# Patient Record
Sex: Female | Born: 1960 | Race: White | Hispanic: No | Marital: Married | Smoking: Never smoker
Health system: Southern US, Community
[De-identification: ages and names within clinical notes are randomized; demographics above are authoritative.]

## PROBLEM LIST (undated history)

## (undated) DIAGNOSIS — Z8616 Personal history of COVID-19: Secondary | ICD-10-CM

## (undated) DIAGNOSIS — I10 Essential (primary) hypertension: Secondary | ICD-10-CM

## (undated) DIAGNOSIS — E785 Hyperlipidemia, unspecified: Secondary | ICD-10-CM

## (undated) HISTORY — PX: GALLBLADDER SURGERY: SHX652

## (undated) HISTORY — PX: TONSILLECTOMY: SUR1361

## (undated) HISTORY — DX: Personal history of COVID-19: Z86.16

## (undated) HISTORY — DX: Hyperlipidemia, unspecified: E78.5

## (undated) HISTORY — DX: Essential (primary) hypertension: I10

---

## 2003-02-22 ENCOUNTER — Ambulatory Visit (HOSPITAL_COMMUNITY): Admission: RE | Admit: 2003-02-22 | Discharge: 2003-02-22 | Payer: Self-pay | Admitting: Family Medicine

## 2003-02-22 ENCOUNTER — Encounter: Payer: Self-pay | Admitting: Family Medicine

## 2003-04-12 ENCOUNTER — Other Ambulatory Visit: Admission: RE | Admit: 2003-04-12 | Discharge: 2003-04-12 | Payer: Self-pay | Admitting: Family Medicine

## 2003-06-08 ENCOUNTER — Encounter: Admission: RE | Admit: 2003-06-08 | Discharge: 2003-09-06 | Payer: Self-pay | Admitting: Specialist

## 2003-12-29 ENCOUNTER — Encounter (INDEPENDENT_AMBULATORY_CARE_PROVIDER_SITE_OTHER): Payer: Self-pay | Admitting: Specialist

## 2003-12-29 ENCOUNTER — Ambulatory Visit (HOSPITAL_COMMUNITY): Admission: RE | Admit: 2003-12-29 | Discharge: 2003-12-29 | Payer: Self-pay | Admitting: Gastroenterology

## 2004-05-24 ENCOUNTER — Ambulatory Visit (HOSPITAL_COMMUNITY): Admission: RE | Admit: 2004-05-24 | Discharge: 2004-05-24 | Payer: Self-pay | Admitting: Family Medicine

## 2006-06-26 ENCOUNTER — Other Ambulatory Visit: Admission: RE | Admit: 2006-06-26 | Discharge: 2006-06-26 | Payer: Self-pay | Admitting: Family Medicine

## 2007-11-04 ENCOUNTER — Ambulatory Visit (HOSPITAL_COMMUNITY): Admission: RE | Admit: 2007-11-04 | Discharge: 2007-11-04 | Payer: Self-pay | Admitting: Family Medicine

## 2007-11-12 ENCOUNTER — Other Ambulatory Visit: Admission: RE | Admit: 2007-11-12 | Discharge: 2007-11-12 | Payer: Self-pay | Admitting: Family Medicine

## 2009-01-25 ENCOUNTER — Ambulatory Visit (HOSPITAL_COMMUNITY): Admission: RE | Admit: 2009-01-25 | Discharge: 2009-01-25 | Payer: Self-pay | Admitting: Gastroenterology

## 2009-10-04 ENCOUNTER — Other Ambulatory Visit: Admission: RE | Admit: 2009-10-04 | Discharge: 2009-10-04 | Payer: Self-pay | Admitting: Obstetrics and Gynecology

## 2010-05-24 ENCOUNTER — Other Ambulatory Visit (HOSPITAL_COMMUNITY): Payer: Self-pay | Admitting: Obstetrics and Gynecology

## 2010-05-24 DIAGNOSIS — Z139 Encounter for screening, unspecified: Secondary | ICD-10-CM

## 2010-06-05 ENCOUNTER — Ambulatory Visit (HOSPITAL_COMMUNITY)
Admission: RE | Admit: 2010-06-05 | Discharge: 2010-06-05 | Disposition: A | Discharge status: Home / Self Care | Payer: 59 | Source: Ambulatory Visit | Attending: Obstetrics and Gynecology | Admitting: Obstetrics and Gynecology

## 2010-06-05 DIAGNOSIS — Z1231 Encounter for screening mammogram for malignant neoplasm of breast: Secondary | ICD-10-CM | POA: Insufficient documentation

## 2010-06-05 DIAGNOSIS — Z139 Encounter for screening, unspecified: Secondary | ICD-10-CM

## 2010-09-15 NOTE — Op Note (Signed)
NAME:  Vickie Shaw, Vickie Shaw                        ACCOUNT NO.:  [id number]   MEDICAL RECORD NO.:  [id number]                   PATIENT TYPE:  AMB   LOCATION:  ENDO                                 FACILITY:  [facility]   PHYSICIAN:  Bernette Redbird, M.D.                DATE OF BIRTH:  [birth year]   DATE OF PROCEDURE:  12/29/2003  DATE OF DISCHARGE:                                 OPERATIVE REPORT   PROCEDURE:  Colonoscopy with biopsy.   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   FINDINGS:  Diminutive sessile polyp removed.   PROCEDURE:  The nature, purposes, and risks of the procedure have been  discussed with the patient, who provided her consent.  Sedation was fentanyl  75 mcg and Versed 8 mg IV without arrhythmias or desaturation.  The Olympus  adjustable tension pediatric video colonoscope is readily advanced to the  terminal ileum, which had a normal appearance.  A pull-back was performed.  The quality of the prep was excellent.  It was felt that all areas were well  seen.  The only abnormality on this exam was a 2-3 mm sessile hyperplastic-  appearing polyp, removed by single cold biopsy on the way in, from the  region, I believe, of the hepatic flexure.  No other polyps were seen, and  there was no evidence of cancer, colitis, vascular malformations, or  diverticulosis.  Retroflexion in the rectum and reinspection of the rectum  was unremarkable.  The patient tolerated the procedure well, and there were  no apparent complications.   IMPRESSION:  1.  Solitary diminutive colon polyp, removed as described above.  (211.3).  2.  Family history of colon cancer.  3.  Family history of colon polyps.   PLAN:  Await pathology on the polyp with anticipated colonoscopic followup  in five years, in view of the family  history.                                               Bernette Redbird, M.D.    RB/MEDQ  D:  12/29/2003  T:  12/29/2003  Job:  [id number]   cc:   Angelena Sole, M.D. [facility]

## 2011-06-04 ENCOUNTER — Other Ambulatory Visit (HOSPITAL_COMMUNITY): Payer: Self-pay | Admitting: Obstetrics and Gynecology

## 2011-06-04 DIAGNOSIS — Z1231 Encounter for screening mammogram for malignant neoplasm of breast: Secondary | ICD-10-CM

## 2011-07-02 ENCOUNTER — Ambulatory Visit (HOSPITAL_COMMUNITY)
Admission: RE | Admit: 2011-07-02 | Discharge: 2011-07-02 | Disposition: A | Discharge status: Home / Self Care | Payer: 59 | Source: Ambulatory Visit | Attending: Obstetrics and Gynecology | Admitting: Obstetrics and Gynecology

## 2011-07-02 DIAGNOSIS — Z1231 Encounter for screening mammogram for malignant neoplasm of breast: Secondary | ICD-10-CM | POA: Insufficient documentation

## 2012-03-21 IMAGING — MG MM DIGITAL SCREENING BILAT W/ CAD
4 series · 4 of 4 positions shown · non-contrast
Comparison: Prior studies.

DG SCREEN MAMMOGRAM BILATERAL
Bilateral CC and MLO view(s) were taken.
Technologist: Dainara Perao, RT RM
Prior study comparison: November 04, 2007, DG screen mammogram bilateral.

DIGITAL SCREENING MAMMOGRAM WITH CAD:

[R CC]
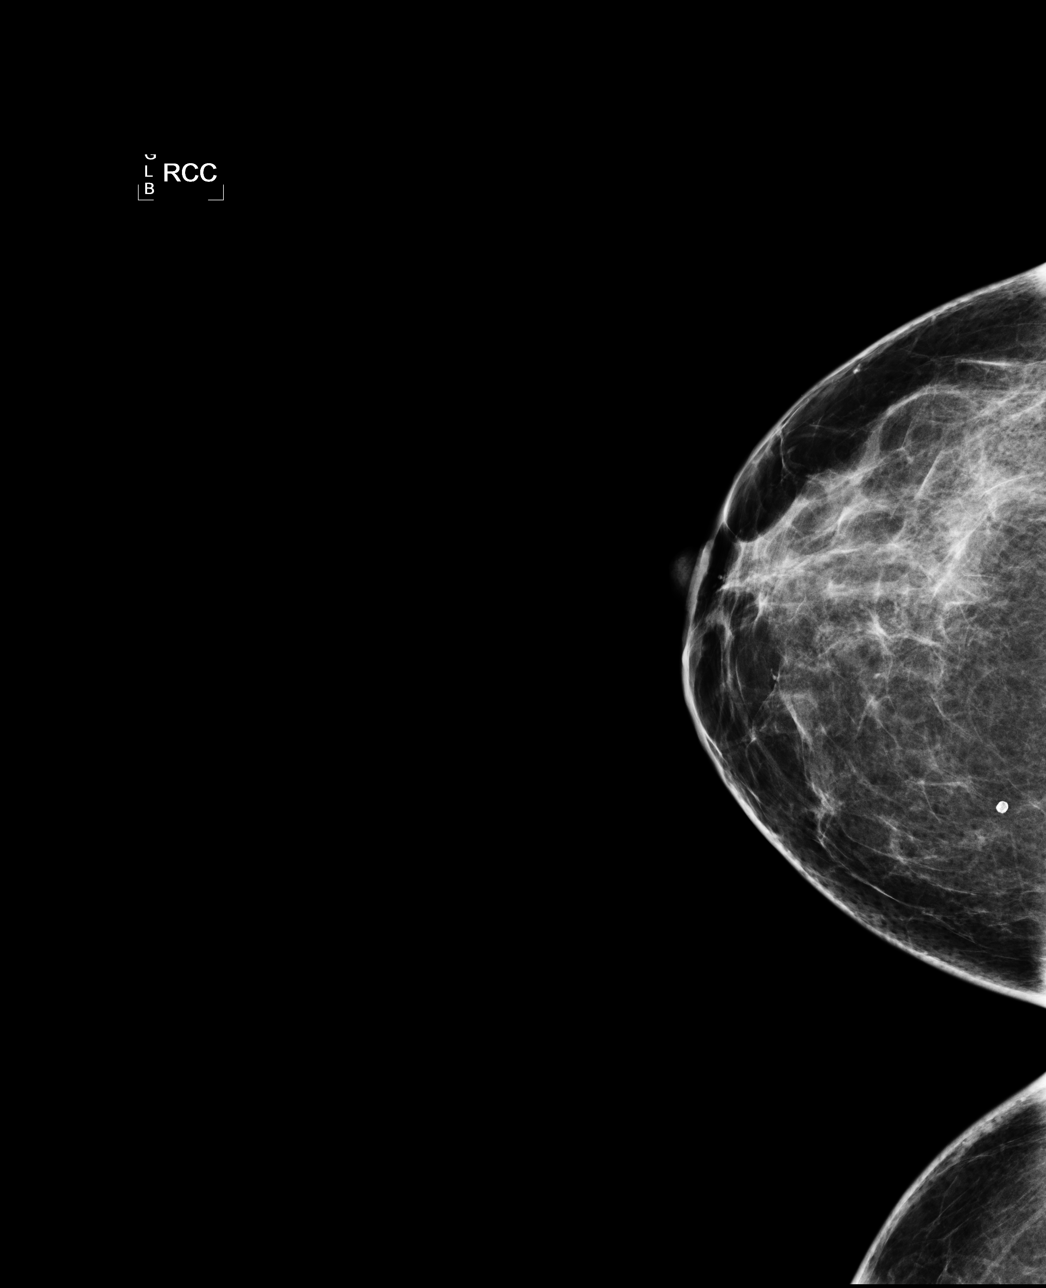

[R MLO]
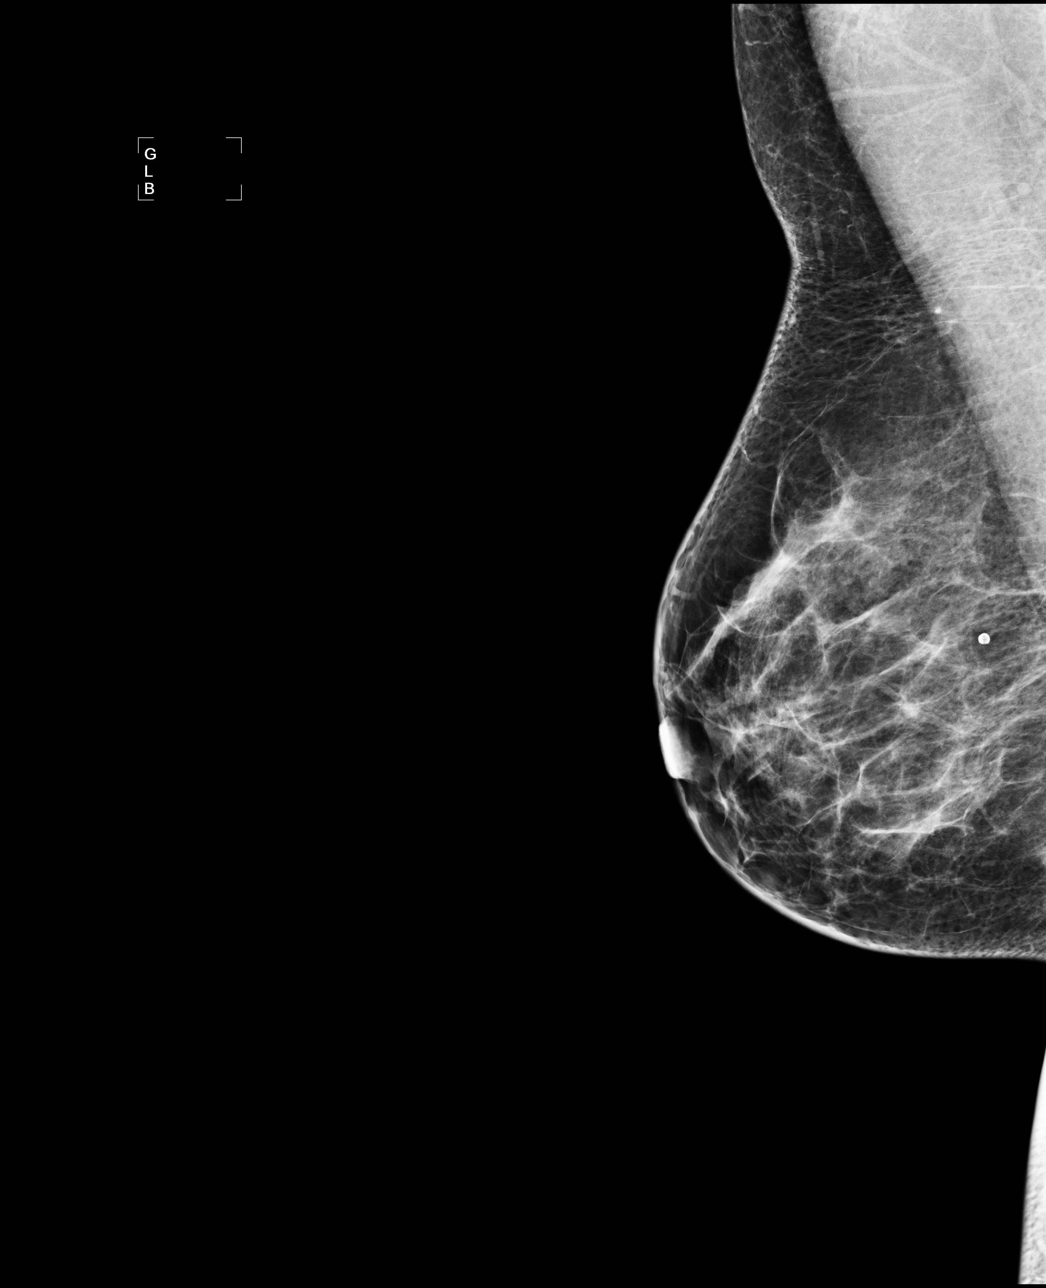

[L CC]
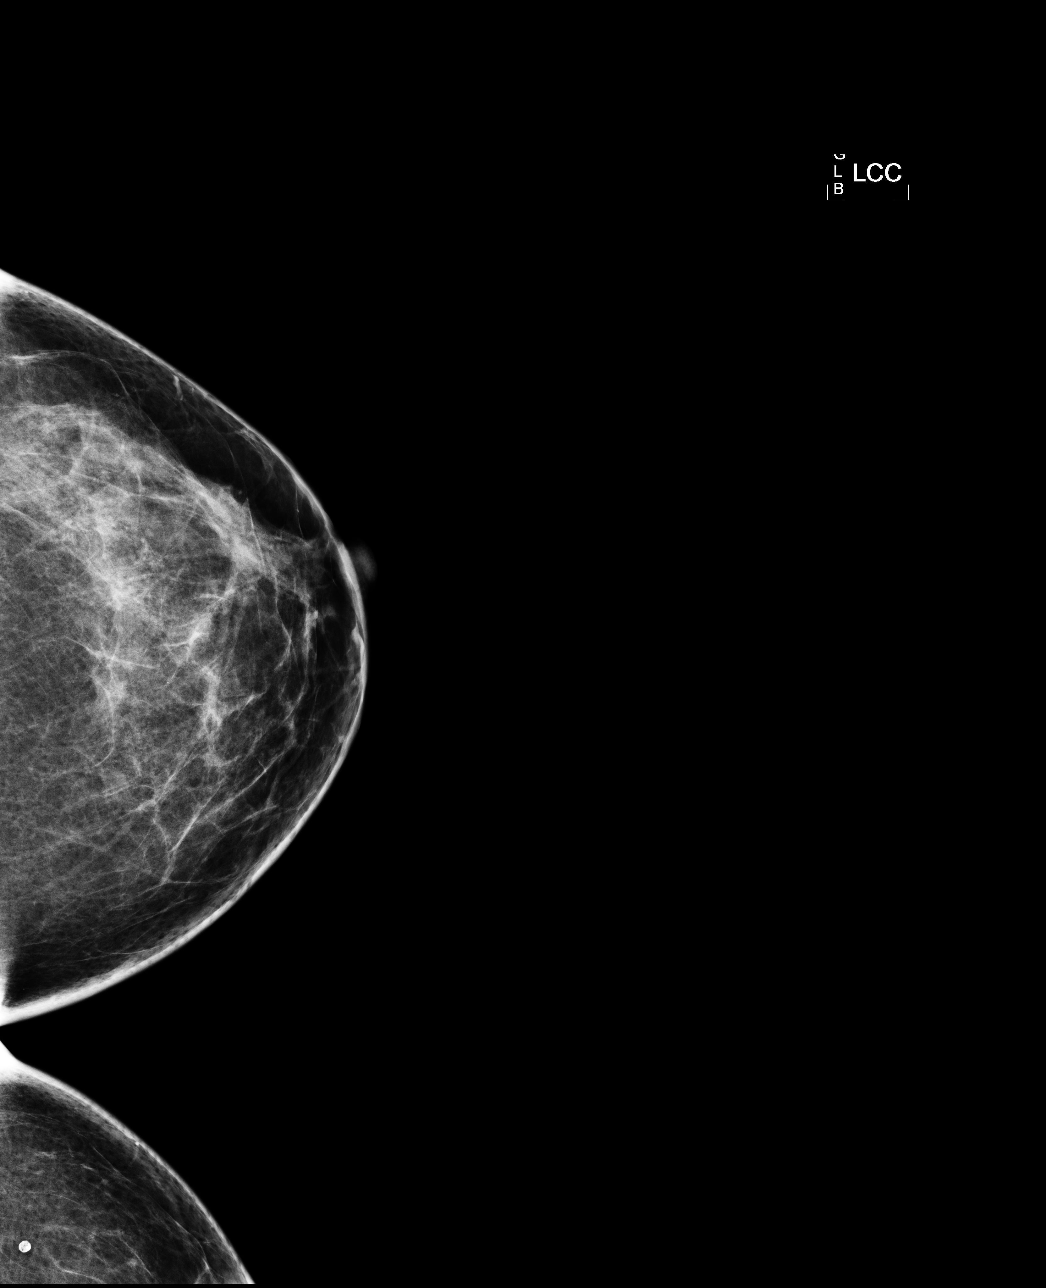

[L MLO]
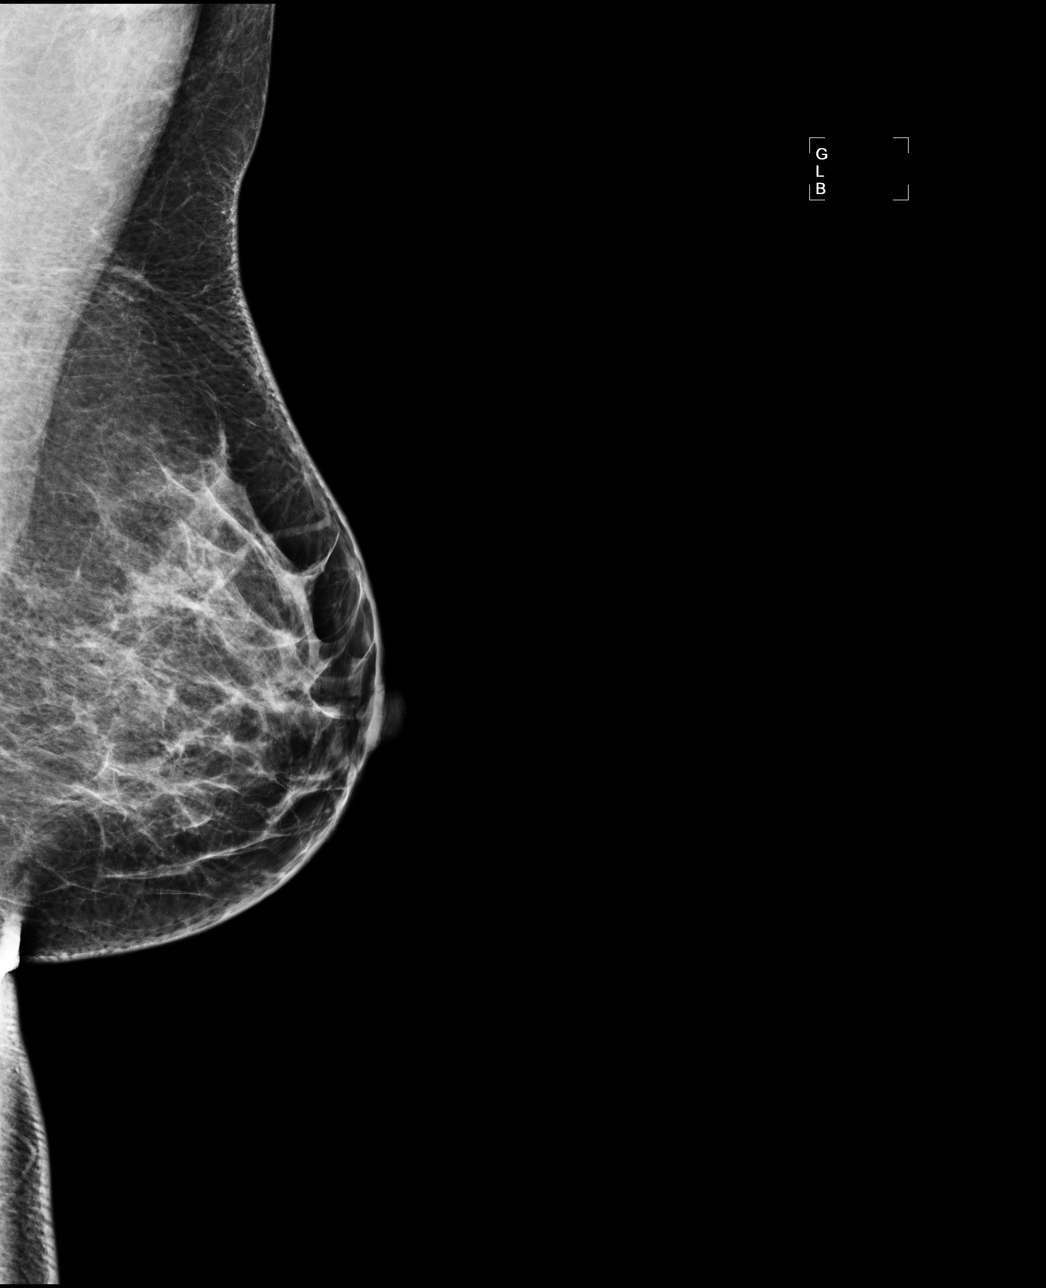

[4 of 4 positions shown; findings below may reference images not displayed]

There are scattered fibroglandular densities.  There is no dominant mass, architectural distortion 
or calcification to suggest malignancy.

Images were processed with CAD.
IMPRESSION: No mammographic evidence of malignancy.  Suggest yearly screening mammography.

A result letter of this screening mammogram will be mailed directly to the patient.

ASSESSMENT: Negative - BI-RADS 1

Screening mammogram in 1 year.
,

## 2014-07-02 ENCOUNTER — Other Ambulatory Visit: Payer: Self-pay | Admitting: Gastroenterology

## 2018-02-21 ENCOUNTER — Encounter: Payer: Self-pay | Admitting: Podiatry

## 2018-02-21 ENCOUNTER — Ambulatory Visit: Payer: 59 | Admitting: Podiatry

## 2018-02-21 ENCOUNTER — Ambulatory Visit (INDEPENDENT_AMBULATORY_CARE_PROVIDER_SITE_OTHER): Payer: 59

## 2018-02-21 ENCOUNTER — Ambulatory Visit: Payer: 59

## 2018-02-21 VITALS — BP 143/94 | HR 82

## 2018-02-21 DIAGNOSIS — M779 Enthesopathy, unspecified: Principal | ICD-10-CM

## 2018-02-21 DIAGNOSIS — M778 Other enthesopathies, not elsewhere classified: Secondary | ICD-10-CM

## 2018-02-21 DIAGNOSIS — M722 Plantar fascial fibromatosis: Secondary | ICD-10-CM | POA: Diagnosis not present

## 2018-02-21 NOTE — Patient Instructions (Signed)
If was nice to meet you today. If you have any questions or any further concerns, please feel fee to give me a call. You can call our office at [phone] or please feel fee to send me a message through MyChart.   Plantar Fasciitis (Heel Spur Syndrome) with Rehab The plantar fascia is a fibrous, ligament-like, soft-tissue structure that spans the bottom of the foot. Plantar fasciitis is a condition that causes pain in the foot due to inflammation of the tissue. SYMPTOMS  Pain and tenderness on the underneath side of the foot. Pain that worsens with standing or walking. CAUSES  Plantar fasciitis is caused by irritation and injury to the plantar fascia on the underneath side of the foot. Common mechanisms of injury include: Direct trauma to bottom of the foot. Damage to a small nerve that runs under the foot where the main fascia attaches to the heel bone. Stress placed on the plantar fascia due to bone spurs. RISK INCREASES WITH:  Activities that place stress on the plantar fascia (running, jumping, pivoting, or cutting). Poor strength and flexibility. Improperly fitted shoes. Tight calf muscles. Flat feet. Failure to warm-up properly before activity. Obesity. PREVENTION Warm up and stretch properly before activity. Allow for adequate recovery between workouts. Maintain physical fitness: Strength, flexibility, and endurance. Cardiovascular fitness. Maintain a health body weight. Avoid stress on the plantar fascia. Wear properly fitted shoes, including arch supports for individuals who have flat feet.  PROGNOSIS  If treated properly, then the symptoms of plantar fasciitis usually resolve without surgery. However, occasionally surgery is necessary.  RELATED COMPLICATIONS  Recurrent symptoms that may result in a chronic condition. Problems of the lower back that are caused by compensating for the injury, such as limping. Pain or weakness of the foot during push-off following  surgery. Chronic inflammation, scarring, and partial or complete fascia tear, occurring more often from repeated injections.  TREATMENT  Treatment initially involves the use of ice and medication to help reduce pain and inflammation. The use of strengthening and stretching exercises may help reduce pain with activity, especially stretches of the Achilles tendon. These exercises may be performed at home or with a therapist. Your caregiver may recommend that you use heel cups of arch supports to help reduce stress on the plantar fascia. Occasionally, corticosteroid injections are given to reduce inflammation. If symptoms persist for greater than 6 months despite non-surgical (conservative), then surgery may be recommended.   MEDICATION  If pain medication is necessary, then nonsteroidal anti-inflammatory medications, such as aspirin and ibuprofen, or other minor pain relievers, such as acetaminophen, are often recommended. Do not take pain medication within 7 days before surgery. Prescription pain relievers may be given if deemed necessary by your caregiver. Use only as directed and only as much as you need. Corticosteroid injections may be given by your caregiver. These injections should be reserved for the most serious cases, because they may only be given a certain number of times.  HEAT AND COLD Cold treatment (icing) relieves pain and reduces inflammation. Cold treatment should be applied for 10 to 15 minutes every 2 to 3 hours for inflammation and pain and immediately after any activity that aggravates your symptoms. Use ice packs or massage the area with a piece of ice (ice massage). Heat treatment may be used prior to performing the stretching and strengthening activities prescribed by your caregiver, physical therapist, or athletic trainer. Use a heat pack or soak the injury in warm water.  SEEK IMMEDIATE MEDICAL CARE IF:   Treatment seems to offer no benefit, or the condition worsens. Any  medications produce adverse side effects.  EXERCISES- RANGE OF MOTION (ROM) AND STRETCHING EXERCISES - Plantar Fasciitis (Heel Spur Syndrome) These exercises may help you when beginning to rehabilitate your injury. Your symptoms may resolve with or without further involvement from your physician, physical therapist or athletic trainer. While completing these exercises, remember:  Restoring tissue flexibility helps normal motion to return to the joints. This allows healthier, less painful movement and activity. An effective stretch should be held for at least 30 seconds. A stretch should never be painful. You should only feel a gentle lengthening or release in the stretched tissue.  RANGE OF MOTION - Toe Extension, Flexion Sit with your right / left leg crossed over your opposite knee. Grasp your toes and gently pull them back toward the top of your foot. You should feel a stretch on the bottom of your toes and/or foot. Hold this stretch for 10 seconds. Now, gently pull your toes toward the bottom of your foot. You should feel a stretch on the top of your toes and or foot. Hold this stretch for 10 seconds. Repeat  times. Complete this stretch 3 times per day.   RANGE OF MOTION - Ankle Dorsiflexion, Active Assisted Remove shoes and sit on a chair that is preferably not on a carpeted surface. Place right / left foot under knee. Extend your opposite leg for support. Keeping your heel down, slide your right / left foot back toward the chair until you feel a stretch at your ankle or calf. If you do not feel a stretch, slide your bottom forward to the edge of the chair, while still keeping your heel down. Hold this stretch for 10 seconds. Repeat 3 times. Complete this stretch 2 times per day.   STRETCH  Gastroc, Standing Place hands on wall. Extend right / left leg, keeping the front knee somewhat bent. Slightly point your toes inward on your back foot. Keeping your right / left heel on the  floor and your knee straight, shift your weight toward the wall, not allowing your back to arch. You should feel a gentle stretch in the right / left calf. Hold this position for 10 seconds. Repeat 3 times. Complete this stretch 2 times per day.  STRETCH  Soleus, Standing Place hands on wall. Extend right / left leg, keeping the other knee somewhat bent. Slightly point your toes inward on your back foot. Keep your right / left heel on the floor, bend your back knee, and slightly shift your weight over the back leg so that you feel a gentle stretch deep in your back calf. Hold this position for 10 seconds. Repeat 3 times. Complete this stretch 2 times per day.  STRETCH  Gastrocsoleus, Standing  Note: This exercise can place a lot of stress on your foot and ankle. Please complete this exercise only if specifically instructed by your caregiver.  Place the ball of your right / left foot on a step, keeping your other foot firmly on the same step. Hold on to the wall or a rail for balance. Slowly lift your other foot, allowing your body weight to press your heel down over the edge of the step. You should feel a stretch in your right / left calf. Hold this position for 10 seconds. Repeat this exercise with a slight bend in your right / left knee. Repeat 3 times. Complete this stretch 2 times per day.   STRENGTHENING EXERCISES -   Plantar Fasciitis (Heel Spur Syndrome)  These exercises may help you when beginning to rehabilitate your injury. They may resolve your symptoms with or without further involvement from your physician, physical therapist or athletic trainer. While completing these exercises, remember:  Muscles can gain both the endurance and the strength needed for everyday activities through controlled exercises. Complete these exercises as instructed by your physician, physical therapist or athletic trainer. Progress the resistance and repetitions only as guided.  STRENGTH - Towel  Curls Sit in a chair positioned on a non-carpeted surface. Place your foot on a towel, keeping your heel on the floor. Pull the towel toward your heel by only curling your toes. Keep your heel on the floor. Repeat 3 times. Complete this exercise 2 times per day.  STRENGTH - Ankle Inversion Secure one end of a rubber exercise band/tubing to a fixed object (table, pole). Loop the other end around your foot just before your toes. Place your fists between your knees. This will focus your strengthening at your ankle. Slowly, pull your big toe up and in, making sure the band/tubing is positioned to resist the entire motion. Hold this position for 10 seconds. Have your muscles resist the band/tubing as it slowly pulls your foot back to the starting position. Repeat 3 times. Complete this exercises 2 times per day.  Document Released: 04/16/2005 Document Revised: 07/09/2011 Document Reviewed: 07/29/2008 [employer] Patient Information 2014 [employer].  

## 2018-02-23 NOTE — Progress Notes (Signed)
Subjective:   Patient ID: Vickie Shaw, female   DOB: 57 y.o.   MRN: [id number]   HPI Vickie Shaw presents the office today for concerns of plantar "cysts" x 2 on the bottom of her left foot.  She states that time she has some aching pain to the area but she has had no treatment.  She states they were larger previously and has become smaller.  She denies any swelling or redness or any skin discoloration or changes.  She has no numbness or tingling.  She has no other concerns.   Review of Systems  All other systems reviewed and are negative.  No past medical history on file.     Current Outpatient Medications:  .  lisinopril (PRINIVIL,ZESTRIL) 2.5 MG tablet, , Disp: , Rfl: 3 .  NON FORMULARY, [facility]  Scar Cream -  Verapamil 10%, Pentoxifylline 5% Apply 1-2 grams to affected area 3-4 times daily Qty. 120 gm 3 refills, Disp: , Rfl:  .  pravastatin (PRAVACHOL) 20 MG tablet, TK 1 T PO D, Disp: , Rfl: 0 .  ibuprofen (ADVIL,MOTRIN) 800 MG tablet, Take by mouth., Disp: , Rfl:  .  Multiple Vitamin (MULTI-VITAMINS) TABS, Take by mouth., Disp: , Rfl:  .  niacin 500 MG tablet, Take by mouth., Disp: , Rfl:  .  Omega-3 1000 MG CAPS, Take by mouth., Disp: , Rfl:  .  valACYclovir (VALTREX) 1000 MG tablet, TK 1 T PO TID PRN, Disp: , Rfl: 0  Allergies  Allergen Reactions  . Loperamide Hives  . Niacin Itching  . Neomycin-Polymyxin-Hc Rash          Objective:  Physical Exam  General: AAO x3, NAD  Dermatological: Skin is warm, dry and supple bilateral. Nails x 10 are well manicured; remaining integument appears unremarkable at this time. There are no open sores, no preulcerative lesions, no rash or signs of infection present.  Vascular: Dorsalis Pedis artery and Posterior Tibial artery pedal pulses are 2/4 bilateral with immedate capillary fill time. Pedal hair growth present. No varicosities and no lower extremity edema present bilateral. There is no pain with calf compression,  swelling, warmth, erythema.   Neruologic: Grossly intact via light touch bilateral. Protective threshold with Semmes Wienstein monofilament intact to all pedal sites bilateral.   Musculoskeletal: On the plantar aspect the left foot on the medial band of plantar fascia to non-mobile soft tissue mass is consistent with a plantar fibroma.  No tenderness palpation of the area.  No edema, erythema.  Muscular strength 5/5 in all groups tested bilateral.  Gait: Unassisted, Nonantalgic.       Assessment:   Plantar fibromatosis x2 left foot     Plan:  -Treatment options discussed including all alternatives, risks, and complications -Etiology of symptoms were discussed -X-rays were obtained and reviewed with the patient.  There is no calcifications present x-ray or foreign body.  No evidence of acute fracture. -I ordered a compound cream to include verapamil from [facility] pharmacy.  -Discussed stretching exercises daily.  In the future if symptoms continue discussed steroid injection as well as inserts to help offload the area.  Vickie Shaw DPM

## 2018-03-12 ENCOUNTER — Encounter: Payer: Self-pay | Admitting: Podiatry

## 2018-03-12 ENCOUNTER — Telehealth: Payer: Self-pay | Admitting: *Deleted

## 2018-03-12 NOTE — Telephone Encounter (Signed)
I sent pt [facility] information by MyChart and sent [facility] Scar Cream as a reorder with note stating pt had not been contacted.

## 2018-03-12 NOTE — Telephone Encounter (Signed)
Pt [facility] through MyChart pharmacy [facility] [address] [place]

## 2019-03-24 ENCOUNTER — Ambulatory Visit (INDEPENDENT_AMBULATORY_CARE_PROVIDER_SITE_OTHER): Payer: 59

## 2019-03-24 ENCOUNTER — Other Ambulatory Visit: Payer: Self-pay

## 2019-03-24 ENCOUNTER — Other Ambulatory Visit: Payer: Self-pay | Admitting: Podiatry

## 2019-03-24 ENCOUNTER — Ambulatory Visit: Payer: 59 | Admitting: Podiatry

## 2019-03-24 DIAGNOSIS — M722 Plantar fascial fibromatosis: Secondary | ICD-10-CM

## 2019-03-24 DIAGNOSIS — M7732 Calcaneal spur, left foot: Secondary | ICD-10-CM

## 2019-03-24 DIAGNOSIS — M79672 Pain in left foot: Secondary | ICD-10-CM

## 2019-03-24 NOTE — Patient Instructions (Signed)

## 2019-03-30 DIAGNOSIS — M722 Plantar fascial fibromatosis: Secondary | ICD-10-CM | POA: Insufficient documentation

## 2019-03-30 DIAGNOSIS — M7732 Calcaneal spur, left foot: Secondary | ICD-10-CM | POA: Insufficient documentation

## 2019-03-30 NOTE — Progress Notes (Signed)
Subjective: 58 year old female presents the office today for follow evaluation of left foot pain.  She states she been doing well however she recently started to develop pain in the bottom of her heel which is worse in the morning after being on her feet all day.  She has tried changing shoes.  She states that she was using the verapamil cream which made it worse.  The pain is more to the plantar aspect the heel as opposed to the fibromas.Denies any systemic complaints such as fevers, chills, nausea, vomiting. No acute changes since last appointment, and no other complaints at this time.   Objective: AAO x3, NAD DP/PT pulses palpable bilaterally, CRT less than 3 seconds Firm nonmobile soft tissue masses in the medial band plantar fashion the arch of the foot consistent with plantar fibromas.  No significant discomfort to this area today.Tenderness to palpation along the plantar medial tubercle of the calcaneus at the insertion of plantar fascia on the LEFT foot. There is no pain along the course of the plantar fascia within the arch of the foot. Plantar fascia appears to be intact. There is no pain with lateral compression of the calcaneus or pain with vibratory sensation. There is no pain along the course or insertion of the achilles tendon. No other areas of tenderness to bilateral lower extremities. No open lesions or pre-ulcerative lesions.  No pain with calf compression, swelling, warmth, erythema  Assessment: Left foot plantar fasciitis  Plan: -All treatment options discussed with the patient including all alternatives, risks, complications.  -X-rays obtained reviewed.  No evidence of acute fracture.  Calcaneal spurring is present. -Steroid injection performed.  See procedure note below.  Also discussed myofascial brace.  Discussed stretching, icing daily.  Continue with supportive shoes and inserts.  Procedure: Injection Tendon/Ligament Discussed alternatives, risks, complications and  verbal consent was obtained.  Location: Left plantar fascia at the glabrous junction; medial approach. Skin Prep: Alcohol. Injectate: 0.5cc 0.5% marcaine plain, 0.5 cc 2% lidocaine plain and, 1 cc kenalog 10. Disposition: Patient tolerated procedure well. Injection site dressed with a band-aid.  Post-injection care was discussed and return precautions discussed.   Return in about 4 weeks (around 04/21/2019) for plantar fasciitis.  Trula Slade DPM

## 2019-04-21 ENCOUNTER — Ambulatory Visit: Payer: 59 | Admitting: Podiatry

## 2020-04-26 ENCOUNTER — Encounter: Payer: Self-pay | Admitting: Cardiology

## 2020-05-03 ENCOUNTER — Encounter: Payer: Self-pay | Admitting: Cardiology

## 2020-05-03 ENCOUNTER — Ambulatory Visit: Payer: 59 | Admitting: Cardiology

## 2020-05-03 ENCOUNTER — Inpatient Hospital Stay: Payer: 59

## 2020-05-03 ENCOUNTER — Other Ambulatory Visit: Payer: Self-pay

## 2020-05-03 VITALS — BP 146/92 | HR 103 | Resp 16 | Ht 64.0 in | Wt 195.0 lb

## 2020-05-03 DIAGNOSIS — Z8616 Personal history of COVID-19: Secondary | ICD-10-CM

## 2020-05-03 DIAGNOSIS — R002 Palpitations: Secondary | ICD-10-CM

## 2020-05-03 DIAGNOSIS — I10 Essential (primary) hypertension: Secondary | ICD-10-CM

## 2020-05-03 DIAGNOSIS — E782 Mixed hyperlipidemia: Secondary | ICD-10-CM

## 2020-05-03 DIAGNOSIS — E6609 Other obesity due to excess calories: Secondary | ICD-10-CM

## 2020-05-03 MED ORDER — LOSARTAN POTASSIUM 50 MG PO TABS: 50.0000 mg | ORAL_TABLET | Freq: Every evening | ORAL | 0 refills | Status: DC

## 2020-05-03 NOTE — Progress Notes (Signed)
Date:  05/03/2020   ID:  Vickie Shaw, DOB 10-24-1960, MRN [id number]  PCP:  Berkley Harvey, NP  Cardiologist:  Rex Kras, DO, [facility] (established care 05/03/2020)  REASON FOR CONSULT: Palpitations  REQUESTING PHYSICIAN:  Berkley Harvey, NP [place],  [place] [address]  Chief Complaint  Patient presents with  . Palpitations  . New Patient (Initial Visit)  . Hypertension    HPI  Vickie Shaw is a 60 y.o. female who presents to the office with a chief complaint of " palpitations and hypertension." Patient's past medical history and cardiovascular risk factors include: Hypertension, Hx of COVID 19 infection, hyperlipidemia, family history of CAD.  She is referred to the office at the request of Berkley Harvey, NP for evaluation of palpitations.  Palpitations: Patient states that since October 2021 she has been experiencing palpitations that have been progressive.  She notices palpitations with effort related activities and skipped beats while she is at rest at night.  No improving or worsening factors.  She denies lightheadedness, dizziness, near syncope or syncope.  No new medications.  No use of herbal supplements, stimulant medications, or weight loss supplements.  No known thyroid disease.  No history of anemia.  No significant caffeine intake.  Patient has not undergone any work-up at outside facility.  In addition, patient states that her blood pressures are not well controlled.  She would like our assistance in regards to medication management for her hypertension.  Patient states that she was diagnosed approximately 3 years ago.  She was placed on lisinopril which was recently discontinued and transitioned to amlodipine 5 mg p.o. daily.  She has a history of COVID-19 infection back in January 2021 prior to vaccinations being available.  Since then she has been vaccinated and also has received a booster.  No family history of premature coronary  disease or sudden cardiac death in immediate family.  But paternal grandfather passed at age of 56 due to MI.   FUNCTIONAL STATUS: Works about 1 mile per day.    ALLERGIES: Allergies  Allergen Reactions  . Loperamide Hives  . Niacin Itching  . Neomycin-Polymyxin-Hc Rash    MEDICATION LIST PRIOR TO VISIT: Current Meds  Medication Sig  . amLODipine (NORVASC) 5 MG tablet Take 5 mg by mouth daily.  . calcium-vitamin D (CAL-CITRATE PLUS VITAMIN D) 250-100 MG-UNIT tablet Take 1 tablet by mouth 2 (two) times daily.  Marland Kitchen ibuprofen (ADVIL,MOTRIN) 800 MG tablet Take by mouth.  . losartan (COZAAR) 50 MG tablet Take 1 tablet (50 mg total) by mouth every evening.  . Multiple Vitamin (MULTI-VITAMINS) TABS Take by mouth.  . Omega-3 1000 MG CAPS Take by mouth.  . pravastatin (PRAVACHOL) 20 MG tablet TK 1 T PO D  . TURMERIC PO Take by mouth.  . valACYclovir (VALTREX) 1000 MG tablet TK 1 T PO TID PRN     PAST MEDICAL HISTORY: Past Medical History:  Diagnosis Date  . History of COVID-19   . Hyperlipidemia   . Hypertension     PAST SURGICAL HISTORY: Past Surgical History:  Procedure Laterality Date  . GALLBLADDER SURGERY    . TONSILLECTOMY      FAMILY HISTORY: The patient family history includes Hypertension in her brother and mother.  SOCIAL HISTORY:  The patient  reports that she has never smoked. She has never used smokeless tobacco. She reports that she does not drink alcohol and does not use drugs.  REVIEW  OF SYSTEMS: Review of Systems  Constitutional: Negative for chills and fever.  HENT: Negative for hoarse voice and nosebleeds.   Eyes: Negative for discharge, double vision and pain.  Cardiovascular: Positive for palpitations. Negative for chest pain, claudication, dyspnea on exertion, leg swelling, near-syncope, orthopnea, paroxysmal nocturnal dyspnea and syncope.  Respiratory: Negative for hemoptysis and shortness of breath.   Musculoskeletal: Negative for muscle cramps and  myalgias.  Gastrointestinal: Negative for abdominal pain, constipation, diarrhea, hematemesis, hematochezia, melena, nausea and vomiting.  Neurological: Negative for dizziness and light-headedness.    PHYSICAL EXAM: Vitals with BMI 05/03/2020 02/21/2018  Height '5\' 4"'  -  Weight 195 lbs -  BMI 42.87 -  Systolic 681 157  Diastolic 92 94  Pulse 262 82    CONSTITUTIONAL: Well-developed and well-nourished. No acute distress.  SKIN: Skin is warm and dry. No rash noted. No cyanosis. No pallor. No jaundice HEAD: Normocephalic and atraumatic.  EYES: No scleral icterus MOUTH/THROAT: Moist oral membranes.  NECK: No JVD present. No thyromegaly noted. No carotid bruits  LYMPHATIC: No visible cervical adenopathy.  CHEST Normal respiratory effort. No intercostal retractions  LUNGS: Clear to auscultation bilaterally. No stridor. No wheezes. No rales.  CARDIOVASCULAR: Regular rate and rhythm, positive S1-S2, no murmurs rubs or gallops appreciated. ABDOMINAL: Obese, soft, nontender, nondistended, positive bowel sounds all 4 quadrants. No apparent ascites.  EXTREMITIES: No peripheral edema, +2DP and PT bilateral. HEMATOLOGIC: No significant bruising NEUROLOGIC: Oriented to person, place, and time. Nonfocal. Normal muscle tone.  PSYCHIATRIC: Normal mood and affect. Normal behavior. Cooperative  CARDIAC DATABASE: EKG: 05/03/2020: Sinus tachycardia, 106 bpm, without underlying injury pattern.  Echocardiogram: No results found for this or any previous visit from the past 1095 days.   Stress Testing: No results found for this or any previous visit from the past 1095 days.  Heart Catheterization: None  LABORATORY DATA: External Labs: Collected: 04/13/2020, [facility], obtained from [facility] Creatinine 0.81 mg/dL. eGFR: 80 mL/min per 1.73 m Potassium 4.1 Hemoglobin: 14.1 g/dL, hematocrit 42.9% Lipid profile:  Total cholesterol 167, triglycerides 156, HDL 32, LDL 104, non-HDL 135,   total cholesterol/HDL cholesterol: 5.2. Hemoglobin A1c: 5.4 TSH: 0.95  IMPRESSION:    ICD-10-CM   1. Palpitations  R00.2 EKG 12-Lead    LONG TERM MONITOR (3-14 DAYS)  2. Benign hypertension  M35 Basic metabolic panel    Magnesium    PCV ECHOCARDIOGRAM COMPLETE    losartan (COZAAR) 50 MG tablet  3. Mixed hyperlipidemia  E78.2   4. History of COVID-19  Z86.16   5. Class 1 obesity due to excess calories without serious comorbidity with body mass index (BMI) of 33.0 to 33.9 in adult  E66.09    Z68.33      RECOMMENDATIONS: JOCELIN SCHUELKE is a 60 y.o. female whose past medical history and cardiac risk factors include: Hypertension, Hx of COVID 19 infection, hyperlipidemia, family history of CAD.  Palpitations:  Progressive over the last 2 to 3 months.  Denies any near-syncope or syncopal events.  Shared decision was to proceed with a 7-day extended Holter monitor to evaluate for underlying dysrhythmias.  Benign essential hypertension:  Patient would like our assistance in regards to titrating her antihypertensive medications.  Recommended the importance of a low-salt diet, such as DASH diet  Increase physical activity as tolerated to 30 minutes a day 5 days a week.  Add losartan 50 mg p.o. every afternoon  Blood work in 1 week to evaluate kidney function and electrolytes  Patient is asked  to keep a log of her blood pressures and to bring it in at the next office visit.  Hyperlipidemia: Currently on statin therapy.  Currently managed by primary care provider.  Outside labs independently reviewed and noted above  For now, would focus on the management of palpitations and hypertension.  However, patient would benefit from a GXT at a later date once her blood pressure is better controlled and palpitations have subsided.  She has multiple cardiovascular risk factors including hypertension, hyperlipidemia, and family history of CAD.  Patient is agreeable with the plan of care as  discussed above.  I would like to see her back in 4 weeks or sooner if needed.   FINAL MEDICATION LIST END OF ENCOUNTER: Meds ordered this encounter  Medications  . losartan (COZAAR) 50 MG tablet    Sig: Take 1 tablet (50 mg total) by mouth every evening.    Dispense:  90 tablet    Refill:  0    Medications Discontinued During This Encounter  Medication Reason  . lisinopril (PRINIVIL,ZESTRIL) 2.5 MG tablet Change in therapy  . NON FORMULARY Patient Preference     Current Outpatient Medications:  .  amLODipine (NORVASC) 5 MG tablet, Take 5 mg by mouth daily., Disp: , Rfl:  .  calcium-vitamin D (CAL-CITRATE PLUS VITAMIN D) 250-100 MG-UNIT tablet, Take 1 tablet by mouth 2 (two) times daily., Disp: , Rfl:  .  ibuprofen (ADVIL,MOTRIN) 800 MG tablet, Take by mouth., Disp: , Rfl:  .  losartan (COZAAR) 50 MG tablet, Take 1 tablet (50 mg total) by mouth every evening., Disp: 90 tablet, Rfl: 0 .  Multiple Vitamin (MULTI-VITAMINS) TABS, Take by mouth., Disp: , Rfl:  .  Omega-3 1000 MG CAPS, Take by mouth., Disp: , Rfl:  .  pravastatin (PRAVACHOL) 20 MG tablet, TK 1 T PO D, Disp: , Rfl: 0 .  TURMERIC PO, Take by mouth., Disp: , Rfl:  .  valACYclovir (VALTREX) 1000 MG tablet, TK 1 T PO TID PRN, Disp: , Rfl: 0 .  niacin 500 MG tablet, Take by mouth., Disp: , Rfl:   Orders Placed This Encounter  Procedures  . Basic metabolic panel  . Magnesium  . LONG TERM MONITOR (3-14 DAYS)  . EKG 12-Lead  . PCV ECHOCARDIOGRAM COMPLETE    There are no Patient Instructions on file for this visit.   --Continue cardiac medications as reconciled in final medication list. --Return in about 4 weeks (around 05/31/2020) for Follow up blood pressure and palpitations.. Or sooner if needed. --Continue follow-up with your primary care physician regarding the management of your other chronic comorbid conditions.  Patient's questions and concerns were addressed to her satisfaction. She voices understanding of the  instructions provided during this encounter.   This note was created using a voice recognition software as a result there may be grammatical errors inadvertently enclosed that do not reflect the nature of this encounter. Every attempt is made to correct such errors.  Rex Kras, [place], [facility]  Pager: [phone] Office: [phone]

## 2020-05-10 ENCOUNTER — Other Ambulatory Visit: Payer: Self-pay | Admitting: Cardiology

## 2020-05-11 LAB — MAGNESIUM: Magnesium: 2.3 mg/dL (ref 1.6–2.3)

## 2020-05-11 LAB — BASIC METABOLIC PANEL
BUN/Creatinine Ratio: 13 (ref 9–23)
BUN: 11 mg/dL (ref 6–24)
CO2: 27 mmol/L (ref 20–29)
Calcium: 9.7 mg/dL (ref 8.7–10.2)
Chloride: 101 mmol/L (ref 96–106)
Creatinine, Ser: 0.86 mg/dL (ref 0.57–1.00)
GFR calc Af Amer: 86 mL/min/{1.73_m2} (ref >59)
GFR calc non Af Amer: 75 mL/min/{1.73_m2} (ref >59)
Glucose: 104 mg/dL — ABNORMAL HIGH (ref 65–99)
Potassium: 4 mmol/L (ref 3.5–5.2)
Sodium: 141 mmol/L (ref 134–144)

## 2020-05-13 ENCOUNTER — Other Ambulatory Visit: Payer: Self-pay

## 2020-05-13 ENCOUNTER — Ambulatory Visit: Payer: 59

## 2020-05-13 DIAGNOSIS — I10 Essential (primary) hypertension: Secondary | ICD-10-CM

## 2020-05-16 ENCOUNTER — Other Ambulatory Visit: Payer: 59

## 2020-05-30 ENCOUNTER — Other Ambulatory Visit: Payer: Self-pay

## 2020-05-30 DIAGNOSIS — I10 Essential (primary) hypertension: Secondary | ICD-10-CM

## 2020-05-31 ENCOUNTER — Ambulatory Visit: Payer: 59 | Admitting: Cardiology

## 2020-06-09 ENCOUNTER — Ambulatory Visit: Payer: 59 | Admitting: Cardiology

## 2020-06-09 ENCOUNTER — Encounter: Payer: Self-pay | Admitting: Cardiology

## 2020-06-09 ENCOUNTER — Other Ambulatory Visit: Payer: Self-pay

## 2020-06-09 VITALS — BP 133/83 | HR 89 | Temp 98.1°F | Resp 16 | Ht 64.0 in | Wt 195.8 lb

## 2020-06-09 DIAGNOSIS — Z6833 Body mass index (BMI) 33.0-33.9, adult: Secondary | ICD-10-CM

## 2020-06-09 DIAGNOSIS — Z8616 Personal history of COVID-19: Secondary | ICD-10-CM

## 2020-06-09 DIAGNOSIS — I10 Essential (primary) hypertension: Secondary | ICD-10-CM

## 2020-06-09 DIAGNOSIS — E782 Mixed hyperlipidemia: Secondary | ICD-10-CM

## 2020-06-09 DIAGNOSIS — R002 Palpitations: Secondary | ICD-10-CM

## 2020-06-09 DIAGNOSIS — E6609 Other obesity due to excess calories: Secondary | ICD-10-CM

## 2020-06-09 MED ORDER — HYDROCHLOROTHIAZIDE 25 MG PO TABS: 25.0000 mg | ORAL_TABLET | Freq: Every morning | ORAL | 0 refills | Status: DC

## 2020-06-09 NOTE — Progress Notes (Signed)
Date:  06/12/2020   ID:  Vickie Shaw, DOB [birth year], MRN [id number]  PCP:  Berkley Harvey, NP  Cardiologist:  Rex Kras, DO, [facility] (established care 05/03/2020)  Date: 06/09/20 Last Office Visit: 05/03/2020  Chief Complaint  Patient presents with  . Palpitations  . Hypertension  . Follow-up    HPI  Vickie Shaw is a 60 y.o. female who presents to the office with a chief complaint of " follow up on palpitations and hypertension." Patient's past medical history and cardiovascular risk factors include: Hypertension, Hx of COVID 19 infection, hyperlipidemia, family history of CAD.  She is referred to the office at the request of Berkley Harvey, NP for evaluation of palpitations.  Patient initially presented to the office for evaluation of palpitations and also requested assistance in the management of her hypertension.  Her symptoms of palpitation were going on for the last 2 or 3 months prior to presentation.  Without any near-syncope or syncopal events.  The shared decision was to proceed with 7-day extended Holter monitor to evaluate for any underlying dysrhythmias.  Results of the Holter monitor reviewed with the patient at today's office visit.  Since last office visit she states that the frequency of her palpitations have significantly improved and appear to be almost resolved as well.  With regards to hypertension she tolerated the initiation of losartan 50 mg p.o. every afternoon well without any side effects or intolerances.  Home blood pressure log reviewed.  Patient is very pleased with the results of her home blood pressures with the medication titration initiated during her last office visit.  She has a history of COVID-19 infection back in January 2021 prior to vaccinations being available.  Since then she has been vaccinated and also has received a booster.  No family history of premature coronary disease or sudden cardiac death in immediate family.  But paternal  grandfather passed at age of 63 due to MI.   FUNCTIONAL STATUS: Works about 1-2 mile per day.    ALLERGIES: Allergies  Allergen Reactions  . Loperamide Hives  . Niacin Itching  . Neomycin-Polymyxin-Hc Rash    MEDICATION LIST PRIOR TO VISIT: Current Meds  Medication Sig  . calcium-vitamin D (CAL-CITRATE PLUS VITAMIN D) 250-100 MG-UNIT tablet Take 1 tablet by mouth 2 (two) times daily.  . hydrochlorothiazide (HYDRODIURIL) 25 MG tablet Take 1 tablet (25 mg total) by mouth in the morning.  Marland Kitchen losartan (COZAAR) 50 MG tablet Take 1 tablet (50 mg total) by mouth every evening.  . Multiple Vitamin (MULTI-VITAMINS) TABS Take by mouth.  . Omega-3 1000 MG CAPS Take by mouth.  . pravastatin (PRAVACHOL) 20 MG tablet TK 1 T PO D  . TURMERIC PO Take by mouth.  . valACYclovir (VALTREX) 1000 MG tablet TK 1 T PO TID PRN  . [DISCONTINUED] amLODipine (NORVASC) 5 MG tablet Take 5 mg by mouth daily.     PAST MEDICAL HISTORY: Past Medical History:  Diagnosis Date  . History of COVID-19   . Hyperlipidemia   . Hypertension     PAST SURGICAL HISTORY: Past Surgical History:  Procedure Laterality Date  . GALLBLADDER SURGERY    . TONSILLECTOMY      FAMILY HISTORY: The patient family history includes Hypertension in her brother and mother.  SOCIAL HISTORY:  The patient  reports that she has never smoked. She has never used smokeless tobacco. She reports that she does not drink alcohol and does not use drugs.  REVIEW OF  SYSTEMS: Review of Systems  Constitutional: Negative for chills and fever.  HENT: Negative for hoarse voice and nosebleeds.   Eyes: Negative for discharge, double vision and pain.  Cardiovascular: Positive for palpitations. Negative for chest pain, claudication, dyspnea on exertion, leg swelling, near-syncope, orthopnea, paroxysmal nocturnal dyspnea and syncope.  Respiratory: Negative for hemoptysis and shortness of breath.   Musculoskeletal: Negative for muscle cramps and  myalgias.  Gastrointestinal: Negative for abdominal pain, constipation, diarrhea, hematemesis, hematochezia, melena, nausea and vomiting.  Neurological: Negative for dizziness and light-headedness.    PHYSICAL EXAM: Vitals with BMI 06/09/2020 05/03/2020 02/21/2018  Height $Remov'5\' 4"'KksniS$  $Remove'5\' 4"'cYASjpd$  -  Weight 195 lbs 13 oz 195 lbs -  BMI 44.03 47.42 -  Systolic 595 638 756  Diastolic 83 92 94  Pulse 89 103 82    CONSTITUTIONAL: Well-developed and well-nourished. No acute distress.  SKIN: Skin is warm and dry. No rash noted. No cyanosis. No pallor. No jaundice HEAD: Normocephalic and atraumatic.  EYES: No scleral icterus MOUTH/THROAT: Moist oral membranes.  NECK: No JVD present. No thyromegaly noted. No carotid bruits  LYMPHATIC: No visible cervical adenopathy.  CHEST Normal respiratory effort. No intercostal retractions  LUNGS: Clear to auscultation bilaterally. No stridor. No wheezes. No rales.  CARDIOVASCULAR: Regular rate and rhythm, positive S1-S2, no murmurs rubs or gallops appreciated. ABDOMINAL: Obese, soft, nontender, nondistended, positive bowel sounds all 4 quadrants. No apparent ascites.  EXTREMITIES: No peripheral edema, +2DP and PT bilateral. HEMATOLOGIC: No significant bruising NEUROLOGIC: Oriented to person, place, and time. Nonfocal. Normal muscle tone.  PSYCHIATRIC: Normal mood and affect. Normal behavior. Cooperative  CARDIAC DATABASE: EKG: 05/03/2020: Sinus tachycardia, 106 bpm, without underlying injury pattern.  Echocardiogram: 05/13/2020:  Normal LV systolic function with EF 60%. Left ventricle cavity is normal in size. Normal global wall motion. Doppler evidence of grade I (impaired) diastolic dysfunction, normal LAP. Calculated EF 60%.  Mild pulmonic regurgitation.  Stress Testing: No results found for this or any previous visit from the past 1095 days.  Heart Catheterization: None  7 day extended Holter monitor: Dominant rhythm normal sinus. Heart rate 57-140  bpm.  Avg HR 82 bpm. No atrial fibrillation, supraventricular tachycardia, ventricular tachycardia, high grade AV block, pauses (3 seconds or longer). Total ventricular ectopic burden 2.1%. Total supraventricular ectopic burden <1%. Patient triggered events: 15.  Underlying rhythm sinus mechanism w/ SVE and VE.   LABORATORY DATA: External Labs: Collected: 04/13/2020, [facility], obtained from [facility] Creatinine 0.81 mg/dL. eGFR: 80 mL/min per 1.73 m Potassium 4.1 Hemoglobin: 14.1 g/dL, hematocrit 42.9% Lipid profile:  Total cholesterol 167, triglycerides 156, HDL 32, LDL 104, non-HDL 135,  total cholesterol/HDL cholesterol: 5.2. Hemoglobin A1c: 5.4 TSH: 0.95  Lab Results  Component Value Date   NA 141 05/10/2020   K 4.0 05/10/2020   CO2 27 05/10/2020   GLUCOSE 104 (H) 05/10/2020   BUN 11 05/10/2020   CREATININE 0.86 05/10/2020   CALCIUM 9.7 05/10/2020   GFRNONAA 75 05/10/2020   GFRAA 86 05/10/2020   IMPRESSION:    ICD-10-CM   1. Palpitations  R00.2   2. Benign hypertension  E33 Basic metabolic panel    Magnesium    hydrochlorothiazide (HYDRODIURIL) 25 MG tablet    Magnesium    Basic metabolic panel  3. Mixed hyperlipidemia  E78.2   4. History of COVID-19  Z86.16   5. Class 1 obesity due to excess calories without serious comorbidity with body mass index (BMI) of 33.0 to 33.9 in adult  E66.09  I77.82      RECOMMENDATIONS: KAYDON CREEDON is a 60 y.o. female whose past medical history and cardiac risk factors include: Hypertension, Hx of COVID 19 infection, hyperlipidemia, family history of CAD.  Palpitations: Improving  Denies any near-syncope or syncopal events.  Results of 7-day extended Holter monitor results reviewed with the patient at today's office visit.  Patient triggered events noted sinus mechanism with supraventricular and ventricular ectopic beats.  Shared decision was to monitor it as her symptoms have overall improved.  Benign  essential hypertension: improving.   Patient would like our assistance in regards to titrating her antihypertensive medications.  Recommended the importance of a low-salt diet, such as DASH diet  Increase physical activity as tolerated to 30 minutes a day 5 days a week.  Tolerated losartan 50 mg p.o. every afternoon.  Labs independently reviewed 05/10/2020.   Start HCTZ $RemoveBe'25mg'repHzKgUN$  po qAM.   Blood work in 1 week to evaluate kidney function and electrolytes  Patient is asked to keep a log of her blood pressures and to bring it in at the next office visit.  Hyperlipidemia: Currently on statin therapy.  Currently managed by primary care provider.  Outside labs independently reviewed and noted above  For now, would focus on the management of palpitations and hypertension.  However, patient would benefit from a GXT at a later date once her blood pressure is better controlled and palpitations have subsided.  She has multiple cardiovascular risk factors including hypertension, hyperlipidemia, and family history of CAD.  Patient is agreeable with the plan of care as discussed above.  I would like to see her back in 8 weeks or sooner if needed.   FINAL MEDICATION LIST END OF ENCOUNTER: Meds ordered this encounter  Medications  . hydrochlorothiazide (HYDRODIURIL) 25 MG tablet    Sig: Take 1 tablet (25 mg total) by mouth in the morning.    Dispense:  90 tablet    Refill:  0     Current Outpatient Medications:  .  calcium-vitamin D (CAL-CITRATE PLUS VITAMIN D) 250-100 MG-UNIT tablet, Take 1 tablet by mouth 2 (two) times daily., Disp: , Rfl:  .  hydrochlorothiazide (HYDRODIURIL) 25 MG tablet, Take 1 tablet (25 mg total) by mouth in the morning., Disp: 90 tablet, Rfl: 0 .  losartan (COZAAR) 50 MG tablet, Take 1 tablet (50 mg total) by mouth every evening., Disp: 90 tablet, Rfl: 0 .  Multiple Vitamin (MULTI-VITAMINS) TABS, Take by mouth., Disp: , Rfl:  .  Omega-3 1000 MG CAPS, Take by mouth., Disp: ,  Rfl:  .  pravastatin (PRAVACHOL) 20 MG tablet, TK 1 T PO D, Disp: , Rfl: 0 .  TURMERIC PO, Take by mouth., Disp: , Rfl:  .  valACYclovir (VALTREX) 1000 MG tablet, TK 1 T PO TID PRN, Disp: , Rfl: 0  Orders Placed This Encounter  Procedures  . Basic metabolic panel  . Magnesium    There are no Patient Instructions on file for this visit.   --Continue cardiac medications as reconciled in final medication list. --Return in about 8 weeks (around 08/04/2020) for Follow up, BP, Palpitations. Or sooner if needed. --Continue follow-up with your primary care physician regarding the management of your other chronic comorbid conditions.  Patient's questions and concerns were addressed to her satisfaction. She voices understanding of the instructions provided during this encounter.   This note was created using a voice recognition software as a result there may be grammatical errors inadvertently enclosed that do not reflect  the nature of this encounter. Every attempt is made to correct such errors.  Rex Kras, [place], [facility]  Pager: [phone] Office: [phone]

## 2020-07-01 LAB — BASIC METABOLIC PANEL
BUN/Creatinine Ratio: 11 (ref 9–23)
BUN: 9 mg/dL (ref 6–24)
CO2: 28 mmol/L (ref 20–29)
Calcium: 9.8 mg/dL (ref 8.7–10.2)
Chloride: 95 mmol/L — ABNORMAL LOW (ref 96–106)
Creatinine, Ser: 0.81 mg/dL (ref 0.57–1.00)
Glucose: 86 mg/dL (ref 65–99)
Potassium: 3.3 mmol/L — ABNORMAL LOW (ref 3.5–5.2)
Sodium: 139 mmol/L (ref 134–144)
eGFR: 84 mL/min/{1.73_m2} (ref >59)

## 2020-07-01 LAB — MAGNESIUM: Magnesium: 2.3 mg/dL (ref 1.6–2.3)

## 2020-07-06 ENCOUNTER — Other Ambulatory Visit: Payer: Self-pay

## 2020-07-06 DIAGNOSIS — E782 Mixed hyperlipidemia: Secondary | ICD-10-CM

## 2020-07-06 DIAGNOSIS — I10 Essential (primary) hypertension: Secondary | ICD-10-CM

## 2020-07-06 MED ORDER — POTASSIUM CHLORIDE CRYS ER 20 MEQ PO TBCR: 20.0000 meq | EXTENDED_RELEASE_TABLET | Freq: Every day | ORAL | 0 refills | Status: DC

## 2020-07-06 NOTE — Progress Notes (Signed)
Pt didn't answer left a vm

## 2020-07-06 NOTE — Progress Notes (Signed)
Called and spoke with patient regarding her lab results. Patient aware I am sending K-Dur po daily.

## 2020-07-10 ENCOUNTER — Other Ambulatory Visit: Payer: Self-pay | Admitting: Cardiology

## 2020-07-15 LAB — BASIC METABOLIC PANEL
BUN/Creatinine Ratio: 14 (ref 9–23)
BUN: 12 mg/dL (ref 6–24)
CO2: 23 mmol/L (ref 20–29)
Calcium: 10 mg/dL (ref 8.7–10.2)
Chloride: 95 mmol/L — ABNORMAL LOW (ref 96–106)
Creatinine, Ser: 0.83 mg/dL (ref 0.57–1.00)
Glucose: 94 mg/dL (ref 65–99)
Potassium: 3.5 mmol/L (ref 3.5–5.2)
Sodium: 137 mmol/L (ref 134–144)
eGFR: 82 mL/min/{1.73_m2} (ref >59)

## 2020-07-19 NOTE — Progress Notes (Signed)
Spoke to patient she is aware of her results but patient stated she was never on a supplements she is just including potassium into her dieting

## 2020-07-21 NOTE — Progress Notes (Signed)
Spoke to patient and she said she is fine she doesn't want to do the supplements

## 2020-07-27 ENCOUNTER — Other Ambulatory Visit: Payer: Self-pay | Admitting: Cardiology

## 2020-07-27 DIAGNOSIS — I10 Essential (primary) hypertension: Secondary | ICD-10-CM

## 2020-08-04 NOTE — Progress Notes (Signed)
Date:  08/05/2020   ID:  Vickie Shaw, DOB Jun 18, 1960, MRN [id number]  PCP:  Berkley Harvey, NP  Cardiologist: Rex Kras, DO, [facility] (established care 05/03/2020)  Date: 08/05/20 Last Office Visit: 06/09/2020  Chief Complaint  Patient presents with  . Palpitations  . Hypertension  . Follow-up    4 week    HPI  Vickie Shaw is a 60 y.o. female who presents to the office with a chief complaint of " reevaluation of hypertension, and palpitations." Patient's past medical history and cardiovascular risk factors include: Hypertension, Hx of COVID 19 infection, hyperlipidemia, family history of CAD.  She is referred to the office at the request of Berkley Harvey, NP for evaluation of palpitations.  Patient was referred to the office for evaluation of palpitations.  Since then she underwent an extended Holter monitor which noted normal sinus rhythm with a PVC burden of 2.1%.  However, patient states that her symptoms of palpitation have improved significantly since last office visit.  They are still present intermittently but not affecting her day-to-day activity.  With regards to hypertension patient states that her systolic blood pressures would range before in the 170 mmHg range.  However since her medication titration and education provided during prior office visits her systolic blood pressures are consistently less than 120 mmHg now.  She remains asymptomatic without any episodes of lightheadedness or syncope.  She is very thankful for the medication changes.  No family history of premature coronary disease or sudden cardiac death in immediate family.  But paternal grandfather passed at age of 52 due to MI.   FUNCTIONAL STATUS: Works about 1-2 mile per day.    ALLERGIES: Allergies  Allergen Reactions  . Loperamide Hives  . Niacin Itching  . Neomycin-Polymyxin-Hc Rash    MEDICATION LIST PRIOR TO VISIT: Current Meds  Medication Sig  . calcium-vitamin D (CAL-CITRATE PLUS  VITAMIN D) 250-100 MG-UNIT tablet Take 1 tablet by mouth 2 (two) times daily.  . hydrochlorothiazide (HYDRODIURIL) 25 MG tablet Take 1 tablet (25 mg total) by mouth in the morning.  Marland Kitchen losartan (COZAAR) 50 MG tablet TAKE 1 TABLET(50 MG) BY MOUTH EVERY EVENING  . Multiple Vitamin (MULTI-VITAMINS) TABS Take by mouth.  . Omega-3 1000 MG CAPS Take by mouth.  . pravastatin (PRAVACHOL) 20 MG tablet TK 1 T PO D  . TURMERIC PO Take by mouth.  . valACYclovir (VALTREX) 1000 MG tablet TK 1 T PO TID PRN     PAST MEDICAL HISTORY: Past Medical History:  Diagnosis Date  . History of COVID-19   . Hyperlipidemia   . Hypertension     PAST SURGICAL HISTORY: Past Surgical History:  Procedure Laterality Date  . GALLBLADDER SURGERY    . TONSILLECTOMY      FAMILY HISTORY: The patient family history includes Hypertension in her brother and mother.  SOCIAL HISTORY:  The patient  reports that she has never smoked. She has never used smokeless tobacco. She reports that she does not drink alcohol and does not use drugs.  REVIEW OF SYSTEMS: Review of Systems  Constitutional: Negative for chills and fever.  HENT: Negative for hoarse voice and nosebleeds.   Eyes: Negative for discharge, double vision and pain.  Cardiovascular: Negative for chest pain, claudication, dyspnea on exertion, leg swelling, near-syncope, orthopnea, palpitations, paroxysmal nocturnal dyspnea and syncope.  Respiratory: Negative for hemoptysis and shortness of breath.   Musculoskeletal: Negative for muscle cramps and myalgias.  Gastrointestinal: Negative for abdominal pain, constipation,  diarrhea, hematemesis, hematochezia, melena, nausea and vomiting.  Neurological: Negative for dizziness and light-headedness.    PHYSICAL EXAM: Vitals with BMI 08/05/2020 06/09/2020 05/03/2020  Height '5\' 4"'  '5\' 4"'  '5\' 4"'   Weight 191 lbs 195 lbs 13 oz 195 lbs  BMI 32.77 15.83 09.40  Systolic 768 088 110  Diastolic 78 83 92  Pulse 81 89 103     CONSTITUTIONAL: Well-developed and well-nourished. No acute distress.  SKIN: Skin is warm and dry. No rash noted. No cyanosis. No pallor. No jaundice HEAD: Normocephalic and atraumatic.  EYES: No scleral icterus MOUTH/THROAT: Moist oral membranes.  NECK: No JVD present. No thyromegaly noted. No carotid bruits  LYMPHATIC: No visible cervical adenopathy.  CHEST Normal respiratory effort. No intercostal retractions  LUNGS: Clear to auscultation bilaterally. No stridor. No wheezes. No rales.  CARDIOVASCULAR: Regular rate and rhythm, positive S1-S2, no murmurs rubs or gallops appreciated. ABDOMINAL: Obese, soft, nontender, nondistended, positive bowel sounds all 4 quadrants. No apparent ascites.  EXTREMITIES: No peripheral edema, +2DP and PT bilateral. HEMATOLOGIC: No significant bruising NEUROLOGIC: Oriented to person, place, and time. Nonfocal. Normal muscle tone.  PSYCHIATRIC: Normal mood and affect. Normal behavior. Cooperative  CARDIAC DATABASE: EKG: 05/03/2020: Sinus tachycardia, 106 bpm, without underlying injury pattern.  Echocardiogram: 05/13/2020:  Normal LV systolic function with EF 60%. Left ventricle cavity is normal in size. Normal global wall motion. Doppler evidence of grade I (impaired) diastolic dysfunction, normal LAP. Calculated EF 60%.  Mild pulmonic regurgitation.  Stress Testing: No results found for this or any previous visit from the past 1095 days.  Heart Catheterization: None  7 day extended Holter monitor: Dominant rhythm normal sinus. Heart rate 57-140 bpm.  Avg HR 82 bpm. No atrial fibrillation, supraventricular tachycardia, ventricular tachycardia, high grade AV block, pauses (3 seconds or longer). Total ventricular ectopic burden 2.1%. Total supraventricular ectopic burden <1%. Patient triggered events: 15.  Underlying rhythm sinus mechanism w/ SVE and VE.   LABORATORY DATA: External Labs: Collected: 04/13/2020, [facility], obtained from Care  Everywhere Creatinine 0.81 mg/dL. eGFR: 80 mL/min per 1.73 m Potassium 4.1 Hemoglobin: 14.1 g/dL, hematocrit 42.9% Lipid profile:  Total cholesterol 167, triglycerides 156, HDL 32, LDL 104, non-HDL 135,  total cholesterol/HDL cholesterol: 5.2. Hemoglobin A1c: 5.4 TSH: 0.95  Lab Results  Component Value Date   NA 137 07/14/2020   K 3.5 07/14/2020   CO2 23 07/14/2020   GLUCOSE 94 07/14/2020   BUN 12 07/14/2020   CREATININE 0.83 07/14/2020   CALCIUM 10.0 07/14/2020   GFRNONAA 75 05/10/2020   GFRAA 86 05/10/2020   IMPRESSION:    ICD-10-CM   1. Palpitations  R00.2   2. Benign hypertension  I10 PCV CARDIAC STRESS TEST    SARS-COV-2 RNA,(COVID-19) QUAL NAAT  3. Mixed hyperlipidemia  E78.2   4. History of COVID-19  Z86.16   5. Class 1 obesity due to excess calories without serious comorbidity with body mass index (BMI) of 33.0 to 33.9 in adult  E66.09    Z68.33      RECOMMENDATIONS: Vickie Shaw is a 60 y.o. female whose past medical history and cardiac risk factors include: Hypertension, Hx of COVID 19 infection, hyperlipidemia, family history of CAD.  Palpitations: Essentially resolved  Denies any near-syncope or syncopal events.  Results of the 7-day extended Holter monitor reviewed with the patient.    Patient states that his symptoms are not significant enough to initiate pharmacological therapy.  We will continue to monitor.   Benign essential hypertension:  Blood pressures better controlled.    Medications reconciled.  Independently reviewed labs from 07/14/2020.  Patient's potassium level is at lower limit of normal.  Patient did not tolerate potassium supplements well and has increase her potassium rich foods in her diet.  Recommend a systolic blood pressure of around 120 mmHg.  Increase physical activity as tolerated to 30 minutes a day 5 days a week.  Hyperlipidemia: Currently on statin therapy.  Currently managed by primary care provider.   Since  her blood pressure has improved and given her multiple cardiovascular risk factors recommend a regular treadmill stress test to evaluate for exercise-induced ischemia.  If the GXT is considered low risk study I will see the patient back in 6 months or sooner if needed.  FINAL MEDICATION LIST END OF ENCOUNTER: No orders of the defined types were placed in this encounter.    Current Outpatient Medications:  .  calcium-vitamin D (CAL-CITRATE PLUS VITAMIN D) 250-100 MG-UNIT tablet, Take 1 tablet by mouth 2 (two) times daily., Disp: , Rfl:  .  hydrochlorothiazide (HYDRODIURIL) 25 MG tablet, Take 1 tablet (25 mg total) by mouth in the morning., Disp: 90 tablet, Rfl: 0 .  losartan (COZAAR) 50 MG tablet, TAKE 1 TABLET(50 MG) BY MOUTH EVERY EVENING, Disp: 90 tablet, Rfl: 0 .  Multiple Vitamin (MULTI-VITAMINS) TABS, Take by mouth., Disp: , Rfl:  .  Omega-3 1000 MG CAPS, Take by mouth., Disp: , Rfl:  .  pravastatin (PRAVACHOL) 20 MG tablet, TK 1 T PO D, Disp: , Rfl: 0 .  TURMERIC PO, Take by mouth., Disp: , Rfl:  .  valACYclovir (VALTREX) 1000 MG tablet, TK 1 T PO TID PRN, Disp: , Rfl: 0  Orders Placed This Encounter  Procedures  . SARS-COV-2 RNA,(COVID-19) QUAL NAAT  . PCV CARDIAC STRESS TEST    There are no Patient Instructions on file for this visit.   --Continue cardiac medications as reconciled in final medication list. --Return in about 6 months (around 02/04/2021) for Follow up, Palpitations, BP. Or sooner if needed. --Continue follow-up with your primary care physician regarding the management of your other chronic comorbid conditions.  Patient's questions and concerns were addressed to her satisfaction. She voices understanding of the instructions provided during this encounter.   This note was created using a voice recognition software as a result there may be grammatical errors inadvertently enclosed that do not reflect the nature of this encounter. Every attempt is made to correct such  errors.  Rex Kras, [place], [facility]  Pager: [phone] Office: [phone]

## 2020-08-05 ENCOUNTER — Other Ambulatory Visit: Payer: Self-pay

## 2020-08-05 ENCOUNTER — Ambulatory Visit: Payer: 59 | Admitting: Cardiology

## 2020-08-05 ENCOUNTER — Encounter: Payer: Self-pay | Admitting: Cardiology

## 2020-08-05 VITALS — BP 119/78 | HR 81 | Temp 98.3°F | Resp 16 | Ht 64.0 in | Wt 191.0 lb

## 2020-08-05 DIAGNOSIS — E782 Mixed hyperlipidemia: Secondary | ICD-10-CM

## 2020-08-05 DIAGNOSIS — E6609 Other obesity due to excess calories: Secondary | ICD-10-CM

## 2020-08-05 DIAGNOSIS — Z8616 Personal history of COVID-19: Secondary | ICD-10-CM

## 2020-08-05 DIAGNOSIS — R002 Palpitations: Secondary | ICD-10-CM

## 2020-08-05 DIAGNOSIS — I10 Essential (primary) hypertension: Secondary | ICD-10-CM

## 2020-09-06 ENCOUNTER — Other Ambulatory Visit: Payer: Self-pay | Admitting: Cardiology

## 2020-09-06 DIAGNOSIS — I10 Essential (primary) hypertension: Secondary | ICD-10-CM

## 2020-09-06 LAB — SARS-COV-2 RNA,(COVID-19) QUALITATIVE NAAT: SARS CoV2 RNA: NOT DETECTED

## 2020-09-09 ENCOUNTER — Ambulatory Visit: Payer: 59

## 2020-09-09 ENCOUNTER — Other Ambulatory Visit: Payer: Self-pay

## 2020-09-09 DIAGNOSIS — I10 Essential (primary) hypertension: Secondary | ICD-10-CM

## 2020-09-13 NOTE — Telephone Encounter (Signed)
Her average HR on Holter was 82bpm.  Her current range is not alarming 80s to 102bpm. I would focus on lifestyle changes and if her HR is effecting day to day activities then we could consider low dose BB.

## 2020-10-18 ENCOUNTER — Other Ambulatory Visit: Payer: Self-pay | Admitting: Cardiology

## 2020-10-18 DIAGNOSIS — I10 Essential (primary) hypertension: Secondary | ICD-10-CM

## 2021-01-12 ENCOUNTER — Other Ambulatory Visit: Payer: Self-pay | Admitting: Cardiology

## 2021-01-12 DIAGNOSIS — I10 Essential (primary) hypertension: Secondary | ICD-10-CM

## 2021-02-03 ENCOUNTER — Encounter: Payer: Self-pay | Admitting: Cardiology

## 2021-02-03 ENCOUNTER — Ambulatory Visit: Payer: 59 | Admitting: Cardiology

## 2021-02-03 ENCOUNTER — Other Ambulatory Visit: Payer: Self-pay

## 2021-02-03 VITALS — BP 158/82 | HR 79 | Temp 98.0°F | Ht 64.0 in | Wt 187.0 lb

## 2021-02-03 DIAGNOSIS — I10 Essential (primary) hypertension: Secondary | ICD-10-CM

## 2021-02-03 DIAGNOSIS — I493 Ventricular premature depolarization: Secondary | ICD-10-CM

## 2021-02-03 DIAGNOSIS — Z8616 Personal history of COVID-19: Secondary | ICD-10-CM

## 2021-02-03 DIAGNOSIS — E782 Mixed hyperlipidemia: Secondary | ICD-10-CM

## 2021-02-03 DIAGNOSIS — R002 Palpitations: Secondary | ICD-10-CM

## 2021-02-03 DIAGNOSIS — E6609 Other obesity due to excess calories: Secondary | ICD-10-CM

## 2021-02-03 NOTE — Progress Notes (Signed)
Date:  02/03/2021   ID:  [name], DOB [birth year], MRN [id number]  PCP:  Berkley Harvey, NP  Cardiologist: Rex Kras, DO, [facility] (established care 05/03/2020)  Date: 02/03/21 Last Office Visit: 08/05/2020  Chief Complaint  Patient presents with   Palpitations   Follow-up    HPI  Vickie Shaw is a 60 y.o. female who presents to the office with a chief complaint of " 20-monthfollow-up for reevaluation of palpitations." Patient's past medical history and cardiovascular risk factors include: Hypertension, Hx of COVID 19 infection, hyperlipidemia, family history of CAD.  She is referred to the office at the request of JBerkley Harvey NP for evaluation of palpitations.  Patient was referred to the office for evaluation of palpitation.  She underwent an extended Holter monitor which noted underlying rhythm to be normal sinus but PVC burden of approximately 2.1%.  We discussed pharmacological therapy versus monitoring longitudinally.  Patient chose to avoid pharmacological therapy.  She presents for 639-monthollow-up.  Patient states that the palpitations are " pretty much resolved."  Prior to establishing care patient is SBP would be greater than 170 mmHg after up titration of antihypertensive medications her blood pressures and now are very well controlled.  Patient states that in November 2022 [place] will be moving to [place] to [place] reasons.  She also plans to have labs done with her PCP in November 2022 prior to her move to [place]  FUNCTIONAL STATUS: Works about 1-2 mile per day.    ALLERGIES: Allergies  Allergen Reactions   Loperamide Hives   Niacin Itching   Neomycin-Polymyxin-Hc Rash    MEDICATION LIST PRIOR TO VISIT: Current Meds  Medication Sig   calcium-vitamin D (CAL-CITRATE PLUS VITAMIN D) 250-100 MG-UNIT tablet Take 1 tablet by mouth 2 (two) times daily.   hydrochlorothiazide (HYDRODIURIL) 25 MG tablet TAKE 1 TABLET(25 MG) BY MOUTH IN THE MORNING    losartan (COZAAR) 50 MG tablet TAKE 1 TABLET(50 MG) BY MOUTH EVERY EVENING   Multiple Vitamin (MULTI-VITAMINS) TABS Take by mouth.   Omega-3 1000 MG CAPS Take by mouth.   pravastatin (PRAVACHOL) 20 MG tablet TK 1 T PO D   TURMERIC PO Take by mouth.   valACYclovir (VALTREX) 1000 MG tablet TK 1 T PO TID PRN     PAST MEDICAL HISTORY: Past Medical History:  Diagnosis Date   History of COVID-19    Hyperlipidemia    Hypertension     PAST SURGICAL HISTORY: Past Surgical History:  Procedure Laterality Date   GALLBLADDER SURGERY     TONSILLECTOMY      FAMILY HISTORY: The patient family history includes Hypertension in her brother and mother.  SOCIAL HISTORY:  The patient  reports that she has never smoked. She has never used smokeless tobacco. She reports that she does not drink alcohol and does not use drugs.  REVIEW OF SYSTEMS: Review of Systems  Constitutional: Negative for chills and fever.  HENT:  Negative for hoarse voice and nosebleeds.   Eyes:  Negative for discharge, double vision and pain.  Cardiovascular:  Negative for chest pain, claudication, dyspnea on exertion, leg swelling, near-syncope, orthopnea, palpitations, paroxysmal nocturnal dyspnea and syncope.  Respiratory:  Negative for hemoptysis and shortness of breath.   Musculoskeletal:  Negative for muscle cramps and myalgias.  Gastrointestinal:  Negative for abdominal pain, constipation, diarrhea, hematemesis, hematochezia, melena, nausea and vomiting.  Neurological:  Negative for dizziness and light-headedness.   PHYSICAL EXAM: Vitals with BMI 02/03/2021  08/05/2020 06/09/2020  Height '5\' 4"'  '5\' 4"'  '5\' 4"'   Weight 187 lbs 191 lbs 195 lbs 13 oz  BMI 32.08 27.51 70.01  Systolic 749 449 675  Diastolic 82 78 83  Pulse 79 81 89    CONSTITUTIONAL: Well-developed and well-nourished. No acute distress.  SKIN: Skin is warm and dry. No rash noted. No cyanosis. No pallor. No jaundice HEAD: Normocephalic and atraumatic.  EYES:  No scleral icterus MOUTH/THROAT: Moist oral membranes.  NECK: No JVD present. No thyromegaly noted. No carotid bruits  LYMPHATIC: No visible cervical adenopathy.  CHEST Normal respiratory effort. No intercostal retractions  LUNGS: Clear to auscultation bilaterally. No stridor. No wheezes. No rales.  CARDIOVASCULAR: Regular rate and rhythm, positive S1-S2, no murmurs rubs or gallops appreciated. ABDOMINAL: Obese, soft, nontender, nondistended, positive bowel sounds all 4 quadrants. No apparent ascites.  EXTREMITIES: No peripheral edema, +2DP and PT bilateral. HEMATOLOGIC: No significant bruising NEUROLOGIC: Oriented to person, place, and time. Nonfocal. Normal muscle tone.  PSYCHIATRIC: Normal mood and affect. Normal behavior. Cooperative  CARDIAC DATABASE: EKG: 02/03/2021: Normal sinus rhythm, 69 bpm, normal axis, without underlying ischemia or injury pattern.  Echocardiogram: 05/13/2020:  Normal LV systolic function with EF 60%. Left ventricle cavity is normal in size. Normal global wall motion. Doppler evidence of grade I (impaired) diastolic dysfunction, normal LAP. Calculated EF 60%.  Mild pulmonic regurgitation.  Stress Testing: No results found for this or any previous visit from the past 1095 days.  Heart Catheterization: None  7 day extended Holter monitor: Dominant rhythm normal sinus. Heart rate 57-140 bpm.  Avg HR 82 bpm. No atrial fibrillation, supraventricular tachycardia, ventricular tachycardia, high grade AV block, pauses (3 seconds or longer). Total ventricular ectopic burden 2.1%. Total supraventricular ectopic burden <1%. Patient triggered events: 15.  Underlying rhythm sinus mechanism w/ SVE and VE.   LABORATORY DATA: External Labs: Collected: 04/13/2020, [facility], obtained from Care Everywhere Creatinine 0.81 mg/dL. eGFR: 80 mL/min per 1.73 m Potassium 4.1 Hemoglobin: 14.1 g/dL, hematocrit 42.9% Lipid profile:  Total cholesterol 167, triglycerides  156, HDL 32, LDL 104, non-HDL 135,  total cholesterol/HDL cholesterol: 5.2. Hemoglobin A1c: 5.4 TSH: 0.95  Lab Results  Component Value Date   NA 137 07/14/2020   K 3.5 07/14/2020   CO2 23 07/14/2020   GLUCOSE 94 07/14/2020   BUN 12 07/14/2020   CREATININE 0.83 07/14/2020   CALCIUM 10.0 07/14/2020   GFRNONAA 75 05/10/2020   GFRAA 86 05/10/2020   IMPRESSION:    ICD-10-CM   1. 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 Benign hypertension  I10     4. Mixed hyperlipidemia  E78.2     5. History of COVID-19  Z86.16     6. Class 1 obesity due to excess calories without serious comorbidity with body mass index (BMI) of 32.0 to 32.9 in adult  E66.09    Z68.32        RECOMMENDATIONS: ELGENE CORAL is a 60 y.o. female whose past medical history and cardiac risk factors include: Hypertension, Hx of COVID 19 infection, hyperlipidemia, family history of CAD.  Palpitations Essentially resolved. 7-day extended Holter monitor noted underlying rhythm to be normal sinus with a PVC burden of approximately 2.1%. In the past recommended pharmacological therapy; however, patient wanted to monitor and improving her modifiable risk factors and improving lifestyle. She remains asymptomatic since last office visit.  Benign hypertension Prior to establishing care her systolic blood  pressures would be greater than 170 mmHg. Medications have been uptitrated and her home blood pressures are very well controlled. She understands the importance of a low-salt diet. Blood pressures are now under excellent control.  Mixed hyperlipidemia Currently on pravastatin.   She denies myalgia or other side effects. Patient plans to have repeat blood work in November 2022 with her PCP prior to moving to [place]. Currently managed by primary care provider.  Class 1 obesity due to excess calories without serious comorbidity with body mass index (BMI) of 32.0 to 32.9  in adult Body mass index is 32.1 kg/m. I reviewed with the patient the importance of diet, regular physical activity/exercise, weight loss.   Patient is educated on increasing physical activity gradually as tolerated.  With the goal of moderate intensity exercise for 30 minutes a day 5 days a week.  FINAL MEDICATION LIST END OF ENCOUNTER: No orders of the defined types were placed in this encounter.    Current Outpatient Medications:    calcium-vitamin D (CAL-CITRATE PLUS VITAMIN D) 250-100 MG-UNIT tablet, Take 1 tablet by mouth 2 (two) times daily., Disp: , Rfl:    hydrochlorothiazide (HYDRODIURIL) 25 MG tablet, TAKE 1 TABLET(25 MG) BY MOUTH IN THE MORNING, Disp: 90 tablet, Rfl: 2   losartan (COZAAR) 50 MG tablet, TAKE 1 TABLET(50 MG) BY MOUTH EVERY EVENING, Disp: 90 tablet, Rfl: 0   Multiple Vitamin (MULTI-VITAMINS) TABS, Take by mouth., Disp: , Rfl:    Omega-3 1000 MG CAPS, Take by mouth., Disp: , Rfl:    pravastatin (PRAVACHOL) 20 MG tablet, TK 1 T PO D, Disp: , Rfl: 0   TURMERIC PO, Take by mouth., Disp: , Rfl:    valACYclovir (VALTREX) 1000 MG tablet, TK 1 T PO TID PRN, Disp: , Rfl: 0  Orders Placed This Encounter  Procedures   EKG 12-Lead    There are no Patient Instructions on file for this visit.   --Continue cardiac medications as reconciled in final medication list. --Return in about 6 months (around 08/04/2021) for Follow up, Palpitations, BP. Or sooner if needed. --Continue follow-up with your primary care physician regarding the management of your other chronic comorbid conditions.  Patient's questions and concerns were addressed to her satisfaction. She voices understanding of the instructions provided during this encounter.   This note was created using a voice recognition software as a result there may be grammatical errors inadvertently enclosed that do not reflect the nature of this encounter. Every attempt is made to correct such errors.  Rex Kras, [place],  [facility]  Pager: [phone] Office: [phone]

## 2021-02-06 ENCOUNTER — Ambulatory Visit: Payer: 59 | Admitting: Cardiology
